# Patient Record
Sex: Female | Born: 1960 | Race: White | Hispanic: No | Marital: Married | Smoking: Former smoker
Health system: Southern US, Community
[De-identification: ages and names within clinical notes are randomized; demographics above are authoritative.]

## PROBLEM LIST (undated history)

## (undated) DIAGNOSIS — G8929 Other chronic pain: Secondary | ICD-10-CM

## (undated) DIAGNOSIS — K859 Acute pancreatitis without necrosis or infection, unspecified: Secondary | ICD-10-CM

## (undated) DIAGNOSIS — N3010 Interstitial cystitis (chronic) without hematuria: Secondary | ICD-10-CM

## (undated) HISTORY — PX: CHOLECYSTECTOMY: SHX55

## (undated) HISTORY — PX: ERCP: SHX60

## (undated) HISTORY — PX: ABDOMINAL HYSTERECTOMY: SHX81

---

## 1998-08-07 ENCOUNTER — Inpatient Hospital Stay (HOSPITAL_COMMUNITY): Admission: AD | Admit: 1998-08-07 | Discharge: 1998-08-09 | Payer: Self-pay | Admitting: Obstetrics & Gynecology

## 1998-08-10 ENCOUNTER — Encounter (HOSPITAL_COMMUNITY): Admission: RE | Admit: 1998-08-10 | Discharge: 1998-09-11 | Payer: Self-pay | Admitting: Obstetrics & Gynecology

## 1999-01-23 ENCOUNTER — Ambulatory Visit (HOSPITAL_COMMUNITY): Admission: RE | Admit: 1999-01-23 | Discharge: 1999-01-23 | Payer: Self-pay | Admitting: Gastroenterology

## 1999-01-23 ENCOUNTER — Encounter: Payer: Self-pay | Admitting: Gastroenterology

## 1999-02-12 ENCOUNTER — Observation Stay (HOSPITAL_COMMUNITY): Admission: RE | Admit: 1999-02-12 | Discharge: 1999-02-13 | Payer: Self-pay

## 1999-08-15 ENCOUNTER — Inpatient Hospital Stay (HOSPITAL_COMMUNITY): Admission: EM | Admit: 1999-08-15 | Discharge: 1999-10-18 | Payer: Self-pay | Admitting: Emergency Medicine

## 1999-08-15 ENCOUNTER — Ambulatory Visit (HOSPITAL_COMMUNITY): Admission: RE | Admit: 1999-08-15 | Discharge: 1999-08-15 | Payer: Self-pay | Admitting: Gastroenterology

## 1999-08-15 ENCOUNTER — Encounter: Payer: Self-pay | Admitting: Gastroenterology

## 1999-08-21 ENCOUNTER — Encounter: Payer: Self-pay | Admitting: Gastroenterology

## 1999-08-22 ENCOUNTER — Encounter: Payer: Self-pay | Admitting: Gastroenterology

## 1999-08-26 ENCOUNTER — Encounter: Payer: Self-pay | Admitting: Internal Medicine

## 1999-08-27 ENCOUNTER — Encounter: Payer: Self-pay | Admitting: Internal Medicine

## 1999-08-28 ENCOUNTER — Encounter: Payer: Self-pay | Admitting: Critical Care Medicine

## 1999-08-28 ENCOUNTER — Encounter: Payer: Self-pay | Admitting: *Deleted

## 1999-08-29 ENCOUNTER — Encounter: Payer: Self-pay | Admitting: Internal Medicine

## 1999-08-30 ENCOUNTER — Encounter: Payer: Self-pay | Admitting: Internal Medicine

## 1999-08-31 ENCOUNTER — Encounter: Payer: Self-pay | Admitting: Gastroenterology

## 1999-09-01 ENCOUNTER — Encounter: Payer: Self-pay | Admitting: Gastroenterology

## 1999-09-02 ENCOUNTER — Encounter: Payer: Self-pay | Admitting: Gastroenterology

## 1999-09-03 ENCOUNTER — Encounter: Payer: Self-pay | Admitting: Gastroenterology

## 1999-09-04 ENCOUNTER — Encounter: Payer: Self-pay | Admitting: Gastroenterology

## 1999-09-05 ENCOUNTER — Encounter: Payer: Self-pay | Admitting: Gastroenterology

## 1999-09-09 ENCOUNTER — Encounter: Payer: Self-pay | Admitting: Gastroenterology

## 1999-09-13 ENCOUNTER — Encounter: Payer: Self-pay | Admitting: Gastroenterology

## 1999-09-14 ENCOUNTER — Encounter: Payer: Self-pay | Admitting: Critical Care Medicine

## 1999-09-17 ENCOUNTER — Encounter: Payer: Self-pay | Admitting: Internal Medicine

## 1999-11-27 ENCOUNTER — Ambulatory Visit (HOSPITAL_COMMUNITY): Admission: RE | Admit: 1999-11-27 | Discharge: 1999-11-27 | Payer: Self-pay | Admitting: Gastroenterology

## 2000-02-17 ENCOUNTER — Other Ambulatory Visit: Admission: RE | Admit: 2000-02-17 | Discharge: 2000-02-17 | Payer: Self-pay | Admitting: Obstetrics & Gynecology

## 2004-10-16 ENCOUNTER — Ambulatory Visit: Payer: Self-pay | Admitting: Urology

## 2005-06-13 ENCOUNTER — Observation Stay (HOSPITAL_COMMUNITY): Admission: RE | Admit: 2005-06-13 | Discharge: 2005-06-14 | Payer: Self-pay | Admitting: Gynecology

## 2005-09-24 ENCOUNTER — Ambulatory Visit: Payer: Self-pay | Admitting: Urology

## 2006-09-03 ENCOUNTER — Ambulatory Visit: Payer: Self-pay

## 2006-10-07 ENCOUNTER — Ambulatory Visit: Payer: Self-pay | Admitting: Urology

## 2007-02-17 ENCOUNTER — Ambulatory Visit: Payer: Self-pay | Admitting: Unknown Physician Specialty

## 2007-02-24 ENCOUNTER — Ambulatory Visit: Payer: Self-pay | Admitting: Unknown Physician Specialty

## 2007-10-07 ENCOUNTER — Ambulatory Visit: Payer: Self-pay | Admitting: Unknown Physician Specialty

## 2007-11-11 ENCOUNTER — Ambulatory Visit: Payer: Self-pay

## 2007-12-11 ENCOUNTER — Ambulatory Visit: Payer: Self-pay | Admitting: Internal Medicine

## 2009-01-18 ENCOUNTER — Ambulatory Visit: Payer: Self-pay | Admitting: Unknown Physician Specialty

## 2009-10-31 ENCOUNTER — Ambulatory Visit: Payer: Self-pay | Admitting: Urology

## 2010-04-25 ENCOUNTER — Ambulatory Visit: Payer: Self-pay | Admitting: Obstetrics and Gynecology

## 2010-10-31 ENCOUNTER — Ambulatory Visit: Payer: Self-pay | Admitting: Urology

## 2010-11-06 ENCOUNTER — Ambulatory Visit: Payer: Self-pay | Admitting: Urology

## 2011-10-15 ENCOUNTER — Ambulatory Visit: Payer: Self-pay | Admitting: Urology

## 2011-10-29 ENCOUNTER — Ambulatory Visit: Payer: Self-pay | Admitting: Urology

## 2012-06-30 ENCOUNTER — Ambulatory Visit: Payer: Self-pay | Admitting: Unknown Physician Specialty

## 2012-07-14 ENCOUNTER — Ambulatory Visit: Payer: Self-pay | Admitting: Unknown Physician Specialty

## 2012-07-15 LAB — PATHOLOGY REPORT

## 2012-07-21 ENCOUNTER — Ambulatory Visit: Payer: Self-pay | Admitting: Obstetrics and Gynecology

## 2012-11-10 ENCOUNTER — Ambulatory Visit: Payer: Self-pay | Admitting: Urology

## 2013-03-02 ENCOUNTER — Emergency Department: Payer: Self-pay | Admitting: Emergency Medicine

## 2013-03-02 LAB — COMPREHENSIVE METABOLIC PANEL
Albumin: 3.7 g/dL (ref 3.4–5.0)
Alkaline Phosphatase: 192 U/L — ABNORMAL HIGH (ref 50–136)
Anion Gap: 7 (ref 7–16)
BUN: 6 mg/dL — ABNORMAL LOW (ref 7–18)
Bilirubin,Total: 0.2 mg/dL (ref 0.2–1.0)
Calcium, Total: 8.7 mg/dL (ref 8.5–10.1)
Chloride: 106 mmol/L (ref 98–107)
Co2: 25 mmol/L (ref 21–32)
Creatinine: 0.71 mg/dL (ref 0.60–1.30)
EGFR (African American): >60
EGFR (Non-African Amer.): >60
Glucose: 90 mg/dL (ref 65–99)
Osmolality: 273 (ref 275–301)
Potassium: 4 mmol/L (ref 3.5–5.1)
SGOT(AST): 51 U/L — ABNORMAL HIGH (ref 15–37)
SGPT (ALT): 69 U/L (ref 12–78)
Sodium: 138 mmol/L (ref 136–145)
Total Protein: 7.1 g/dL (ref 6.4–8.2)

## 2013-03-02 LAB — CBC
HCT: 42.7 % (ref 35.0–47.0)
HGB: 13.9 g/dL (ref 12.0–16.0)
MCH: 29.7 pg (ref 26.0–34.0)
MCHC: 32.6 g/dL (ref 32.0–36.0)
MCV: 91 fL (ref 80–100)
Platelet: 184 10*3/uL (ref 150–440)
RBC: 4.69 10*6/uL (ref 3.80–5.20)
RDW: 13 % (ref 11.5–14.5)
WBC: 8.1 10*3/uL (ref 3.6–11.0)

## 2013-03-02 LAB — LIPASE, BLOOD: Lipase: 138 U/L (ref 73–393)

## 2013-03-10 ENCOUNTER — Ambulatory Visit: Payer: Self-pay | Admitting: Unknown Physician Specialty

## 2013-03-14 ENCOUNTER — Ambulatory Visit: Payer: Self-pay | Admitting: Unknown Physician Specialty

## 2013-03-16 LAB — PATHOLOGY REPORT

## 2013-09-21 ENCOUNTER — Ambulatory Visit: Payer: Self-pay | Admitting: Urology

## 2014-01-19 ENCOUNTER — Ambulatory Visit: Payer: Self-pay | Admitting: Obstetrics and Gynecology

## 2015-03-23 NOTE — Op Note (Signed)
PATIENT NAME:  Amanda Gould, Amanda Gould MR#:  [id number] DATE OF BIRTH:  Apr 15, 1961  DATE OF PROCEDURE:  09/21/2013  PREOPERATIVE DIAGNOSIS: Chronic interstitial cystitis.   POSTOPERATIVE DIAGNOSIS: Chronic interstitial cystitis.   PROCEDURE: Cystoscopy with hydrodistention.   SURGEON: Lawana Hartzell C. Bernardo Heater, MD  ASSISTANT: None.   ANESTHESIA: General.   INDICATIONS: This is a 54 year old female with a long history of interstitial cystitis. Her symptoms have been managed with annual cystoscopy and hydrodistention. Over the past 2 months, she complains of increased frequency, urgency and suprapubic discomfort. She denies gross hematuria.   DESCRIPTION OF PROCEDURE: The patient was taken to the cystoscopy suite and placed on the table in the supine position. General anesthetic was administered via a LMA. She was then placed in the low lithotomy position and her external genitalia were prepped and draped in the usual fashion. Timeout was performed per hospital protocol with all in agreement. A 21-French cystoscope sheath with obturator was lubricated and passed per urethra. Panendoscopy was performed and the bladder mucosa was normal in appearance without erythema, solid or papillary lesions. The ureteral orifices were normal-appearing with clear efflux bilaterally. Under 80 cm water pressure, the bladder was filled to equilibrium and kept distended for 5 minutes. Bladder was drained with 1000 mL of saline obtained. Repeat cystoscopy shows no glomerulations or ulcerations and mild mucosal erythema. Bladder was again re-distended in a similar fashion with return of 1200 mL of irrigant. Again, no glomerulations or ulcerations were seen. A B and O suppository was placed per rectum. She was taken to the PACU in stable condition. There were no complications. EBL zero.  ____________________________ Ronda Fairly Bernardo Heater, MD scs:sb D: 09/22/2013 08:18:42 ET T: 09/22/2013 08:32:51 ET JOB#: [id number]  cc: Nicki Reaper C. Bernardo Heater,  MD, <Dictator> Abbie Sons MD ELECTRONICALLY SIGNED 10/05/2013 8:42

## 2016-12-12 DIAGNOSIS — R300 Dysuria: Secondary | ICD-10-CM | POA: Diagnosis not present

## 2017-01-13 DIAGNOSIS — G894 Chronic pain syndrome: Secondary | ICD-10-CM | POA: Diagnosis not present

## 2017-01-13 DIAGNOSIS — R1084 Generalized abdominal pain: Secondary | ICD-10-CM | POA: Diagnosis not present

## 2017-03-05 DIAGNOSIS — G890 Central pain syndrome: Secondary | ICD-10-CM | POA: Diagnosis not present

## 2017-04-08 DIAGNOSIS — G894 Chronic pain syndrome: Secondary | ICD-10-CM | POA: Diagnosis not present

## 2017-04-08 DIAGNOSIS — R1084 Generalized abdominal pain: Secondary | ICD-10-CM | POA: Diagnosis not present

## 2017-04-14 DIAGNOSIS — N952 Postmenopausal atrophic vaginitis: Secondary | ICD-10-CM | POA: Diagnosis not present

## 2017-05-25 DIAGNOSIS — N390 Urinary tract infection, site not specified: Secondary | ICD-10-CM | POA: Diagnosis not present

## 2017-06-24 DIAGNOSIS — G894 Chronic pain syndrome: Secondary | ICD-10-CM | POA: Diagnosis not present

## 2017-07-06 DIAGNOSIS — R1084 Generalized abdominal pain: Secondary | ICD-10-CM | POA: Diagnosis not present

## 2017-07-06 DIAGNOSIS — G894 Chronic pain syndrome: Secondary | ICD-10-CM | POA: Diagnosis not present

## 2017-07-10 DIAGNOSIS — R350 Frequency of micturition: Secondary | ICD-10-CM | POA: Diagnosis not present

## 2017-07-10 DIAGNOSIS — R3915 Urgency of urination: Secondary | ICD-10-CM | POA: Diagnosis not present

## 2017-10-12 DIAGNOSIS — G894 Chronic pain syndrome: Secondary | ICD-10-CM | POA: Diagnosis not present

## 2017-10-12 DIAGNOSIS — N3010 Interstitial cystitis (chronic) without hematuria: Secondary | ICD-10-CM | POA: Diagnosis not present

## 2017-10-12 DIAGNOSIS — R1084 Generalized abdominal pain: Secondary | ICD-10-CM | POA: Diagnosis not present

## 2017-11-27 DIAGNOSIS — H9203 Otalgia, bilateral: Secondary | ICD-10-CM | POA: Diagnosis not present

## 2017-11-27 DIAGNOSIS — J0110 Acute frontal sinusitis, unspecified: Secondary | ICD-10-CM | POA: Diagnosis not present

## 2017-12-06 ENCOUNTER — Telehealth: Payer: Self-pay | Admitting: *Deleted

## 2017-12-06 ENCOUNTER — Other Ambulatory Visit: Payer: Self-pay

## 2017-12-06 ENCOUNTER — Encounter: Payer: Self-pay | Admitting: *Deleted

## 2017-12-06 ENCOUNTER — Ambulatory Visit
Admission: EM | Admit: 2017-12-06 | Discharge: 2017-12-06 | Disposition: A | Discharge status: Home / Self Care | Payer: 59 | Attending: Family Medicine | Admitting: Family Medicine

## 2017-12-06 DIAGNOSIS — H66001 Acute suppurative otitis media without spontaneous rupture of ear drum, right ear: Secondary | ICD-10-CM | POA: Diagnosis not present

## 2017-12-06 DIAGNOSIS — J069 Acute upper respiratory infection, unspecified: Secondary | ICD-10-CM

## 2017-12-06 DIAGNOSIS — R05 Cough: Secondary | ICD-10-CM | POA: Diagnosis not present

## 2017-12-06 HISTORY — DX: Acute pancreatitis without necrosis or infection, unspecified: K85.90

## 2017-12-06 HISTORY — DX: Interstitial cystitis (chronic) without hematuria: N30.10

## 2017-12-06 HISTORY — DX: Other chronic pain: G89.29

## 2017-12-06 MED ORDER — CEFDINIR 300 MG PO CAPS: 300.0000 mg | ORAL_CAPSULE | Freq: Two times a day (BID) | ORAL | 0 refills | Status: DC

## 2017-12-06 MED ORDER — CEFTRIAXONE SODIUM 1 G IJ SOLR
1.0000 g | Freq: Once | INTRAMUSCULAR | Status: AC
  Administered 2017-12-06: 1 g via INTRAMUSCULAR

## 2017-12-06 MED ORDER — AMOXICILLIN 500 MG PO CAPS: 500.0000 mg | ORAL_CAPSULE | Freq: Three times a day (TID) | ORAL | 0 refills | Status: AC

## 2017-12-06 NOTE — ED Provider Notes (Signed)
[facility]    CSN: [id number] Arrival date & time: 12/06/17  0804  History   Chief Complaint Chief Complaint  Patient presents with  . Otalgia  . Nasal Congestion  . Cough   HPI  57 year old female presents with the above complaints.  Patient states that she has been sick since around Christmas.  She saw her primary care doctor on 12/28 and was placed on doxycycline for sinusitis.  She presents today with continued complaints of congestion, cough.  She now has developed right ear pain.  Was severe last night.  She is concerned that she has a ear infection of the right ear.  She has been using over-the-counter medication (Sudafed, Ibuprofen, Benadryl) without improvement.  No reports of fever.  No known exacerbating or relieving factors.  Her pain is currently 5/10 in severity.  No other complaints or concerns at this time.  Past Medical History:  Diagnosis Date  . Chronic pain   . Interstitial cystitis   . Pancreatitis    Past Surgical History:  Procedure Laterality Date  . ABDOMINAL HYSTERECTOMY    . CHOLECYSTECTOMY    . ERCP     OB History    No data available     Home Medications    Prior to Admission medications   Medication Sig Start Date End Date Taking? Authorizing Provider  buPROPion (WELLBUTRIN) 75 MG tablet Take 75 mg by mouth every morning.   Yes [provider]  escitalopram (LEXAPRO) 5 MG tablet Take 5 mg by mouth daily.   Yes [provider]  gabapentin (NEURONTIN) 600 MG tablet Take 2,400 mg by mouth at bedtime.   Yes [provider]  oxyCODONE (OXYCONTIN) 20 mg 12 hr tablet Take 20 mg by mouth daily with lunch.   Yes [provider]  oxyCODONE (OXYCONTIN) 40 mg 12 hr tablet Take 40 mg by mouth every 12 (twelve) hours.   Yes [provider]  OxyCODONE HCl, Abuse Deter, (OXAYDO) 5 MG TABA Take by mouth 4 (four) times daily as needed.   Yes [provider]  topiramate (TOPAMAX) 200 MG tablet  Take 200 mg by mouth at bedtime.   Yes [provider]  cefdinir (OMNICEF) 300 MG capsule Take 1 capsule (300 mg total) by mouth 2 (two) times daily. 12/06/17   Coral Spikes, DO  clonazePAM (KLONOPIN) 0.5 MG tablet Take 0.5 mg by mouth 2 (two) times daily as needed for anxiety.    [provider]  tiZANidine (ZANAFLEX) 4 MG capsule Take 4 mg by mouth 3 (three) times daily as needed for muscle spasms.    [provider]  zolpidem (AMBIEN CR) 12.5 MG CR tablet Take 12.5 mg by mouth at bedtime as needed for sleep.    [provider]   Family History Family History  Problem Relation Age of Onset  . Heart attack Mother   . Heart attack Father    Social History Social History   Tobacco Use  . Smoking status: Former Research scientist (life sciences)  . Smokeless tobacco: Never Used  Substance Use Topics  . Alcohol use: Yes  . Drug use: No   Allergies   Patient has no known allergies.   Review of Systems Review of Systems  HENT: Positive for congestion and ear pain.   Respiratory: Positive for cough.   All other systems reviewed and are negative.  Physical Exam Triage Vital Signs ED Triage Vitals [12/06/17 0818]  Enc Vitals Group     BP  140/73     Pulse Rate 80     Resp 16     Temp 97.9 F (36.6 C)     Temp Source Oral     SpO2 100 %     Weight 114 lb (51.7 kg)     Height 5\' 5"  (1.651 m)     Head Circumference      Peak Flow      Pain Score 5     Pain Loc      Pain Edu?      Excl. in [place]?    Updated Vital Signs BP 140/73 (BP Location: Left Arm)   Pulse 80   Temp 97.9 F (36.6 C) (Oral)   Resp 16   Ht 5\' 5"  (1.651 m)   Wt 114 lb (51.7 kg)   SpO2 100%   BMI 18.97 kg/m     Physical Exam  Constitutional: She is oriented to person, place, and time. She appears well-developed and well-nourished. No distress.  HENT:  Head: Normocephalic and atraumatic.  Left TM normal. Right TM with bulging, erythema, and effusion.  She has a small bullae as well.  Eyes:  Conjunctivae are normal. Right eye exhibits no discharge. Left eye exhibits no discharge.  Neck: Neck supple.  Cardiovascular: Normal rate and regular rhythm.  No murmur heard. Pulmonary/Chest: Effort normal and breath sounds normal. No respiratory distress. She has no wheezes. She has no rales.  Lymphadenopathy:    She has no cervical adenopathy.  Neurological: She is alert and oriented to person, place, and time.  Skin: Skin is warm. No rash noted.  Psychiatric: She has a normal mood and affect. Her behavior is normal.  Nursing note and vitals reviewed.  UC Treatments / Results  Labs (all labs ordered are listed, but only abnormal results are displayed) Labs Reviewed - No data to display  EKG  EKG Interpretation None      Radiology No results found.  Procedures Procedures (including critical care time)  Medications Ordered in UC Medications  cefTRIAXone (ROCEPHIN) injection 1 g (1 g Intramuscular Given 12/06/17 0854)   Initial Impression / Assessment and Plan / UC Course  I have reviewed the triage vital signs and the nursing notes.  Pertinent labs & imaging results that were available during my care of the patient were reviewed by me and considered in my medical decision making (see chart for details).     57 year old female presents with URI and otitis media. Treating with Omnicef (IM Rocephin given today given her reports of severe diarrhea with antibiotics).  Final Clinical Impressions(s) / UC Diagnoses   Final diagnoses:  Upper respiratory tract infection, unspecified type  Acute suppurative otitis media of right ear without spontaneous rupture of tympanic membrane, recurrence not specified   ED Discharge Orders        Ordered    cefdinir (OMNICEF) 300 MG capsule  2 times daily     12/06/17 0852     Controlled Substance Prescriptions [place] Controlled Substance Registry consulted? No   Coral Spikes, [place] 12/06/17 [phone]

## 2017-12-06 NOTE — ED Triage Notes (Signed)
Patient has had symptoms of nasal congestion drainage, cough, sore throat, and ear pain over 1 week ago. Patient reports severe right ear pain starting yesterday. Patient is presently taking doxycycline.

## 2017-12-06 NOTE — Discharge Instructions (Signed)
Antibiotic as prescribed.  Ibuprofen 800 mg three times daily as needed.  Probiotic. Flonase for congestion.  Take care  Dr. Lacinda Axon

## 2018-01-06 DIAGNOSIS — R1084 Generalized abdominal pain: Secondary | ICD-10-CM | POA: Diagnosis not present

## 2018-01-06 DIAGNOSIS — G894 Chronic pain syndrome: Secondary | ICD-10-CM | POA: Diagnosis not present

## 2018-04-06 DIAGNOSIS — G894 Chronic pain syndrome: Secondary | ICD-10-CM | POA: Diagnosis not present

## 2018-04-06 DIAGNOSIS — R1084 Generalized abdominal pain: Secondary | ICD-10-CM | POA: Diagnosis not present

## 2018-05-28 ENCOUNTER — Other Ambulatory Visit: Payer: Self-pay | Admitting: Family

## 2018-05-28 DIAGNOSIS — Z1231 Encounter for screening mammogram for malignant neoplasm of breast: Secondary | ICD-10-CM

## 2018-06-29 DIAGNOSIS — R1084 Generalized abdominal pain: Secondary | ICD-10-CM | POA: Diagnosis not present

## 2018-06-29 DIAGNOSIS — G894 Chronic pain syndrome: Secondary | ICD-10-CM | POA: Diagnosis not present

## 2018-07-14 DIAGNOSIS — Z86010 Personal history of colonic polyps: Secondary | ICD-10-CM | POA: Diagnosis not present

## 2018-07-29 ENCOUNTER — Ambulatory Visit
Admission: RE | Admit: 2018-07-29 | Discharge: 2018-07-29 | Disposition: A | Discharge status: Home / Self Care | Payer: 59 | Source: Ambulatory Visit | Attending: Family | Admitting: Family

## 2018-07-29 DIAGNOSIS — Z1231 Encounter for screening mammogram for malignant neoplasm of breast: Secondary | ICD-10-CM | POA: Insufficient documentation

## 2018-09-09 DIAGNOSIS — D124 Benign neoplasm of descending colon: Secondary | ICD-10-CM | POA: Diagnosis not present

## 2018-09-09 DIAGNOSIS — D127 Benign neoplasm of rectosigmoid junction: Secondary | ICD-10-CM | POA: Diagnosis not present

## 2018-09-09 DIAGNOSIS — D125 Benign neoplasm of sigmoid colon: Secondary | ICD-10-CM | POA: Diagnosis not present

## 2018-09-09 DIAGNOSIS — D128 Benign neoplasm of rectum: Secondary | ICD-10-CM | POA: Diagnosis not present

## 2018-09-09 DIAGNOSIS — K635 Polyp of colon: Secondary | ICD-10-CM | POA: Diagnosis not present

## 2018-09-09 DIAGNOSIS — Z86010 Personal history of colonic polyps: Secondary | ICD-10-CM | POA: Diagnosis not present

## 2018-09-28 DIAGNOSIS — R1084 Generalized abdominal pain: Secondary | ICD-10-CM | POA: Diagnosis not present

## 2018-09-28 DIAGNOSIS — G894 Chronic pain syndrome: Secondary | ICD-10-CM | POA: Diagnosis not present

## 2018-12-07 DIAGNOSIS — G890 Central pain syndrome: Secondary | ICD-10-CM | POA: Diagnosis not present

## 2018-12-07 DIAGNOSIS — Z23 Encounter for immunization: Secondary | ICD-10-CM | POA: Diagnosis not present

## 2018-12-27 DIAGNOSIS — R1084 Generalized abdominal pain: Secondary | ICD-10-CM | POA: Diagnosis not present

## 2018-12-27 DIAGNOSIS — G894 Chronic pain syndrome: Secondary | ICD-10-CM | POA: Diagnosis not present

## 2019-02-09 DIAGNOSIS — R208 Other disturbances of skin sensation: Secondary | ICD-10-CM | POA: Diagnosis not present

## 2019-02-09 DIAGNOSIS — M9901 Segmental and somatic dysfunction of cervical region: Secondary | ICD-10-CM | POA: Diagnosis not present

## 2019-02-09 DIAGNOSIS — M531 Cervicobrachial syndrome: Secondary | ICD-10-CM | POA: Diagnosis not present

## 2019-02-11 DIAGNOSIS — M531 Cervicobrachial syndrome: Secondary | ICD-10-CM | POA: Diagnosis not present

## 2019-02-11 DIAGNOSIS — R208 Other disturbances of skin sensation: Secondary | ICD-10-CM | POA: Diagnosis not present

## 2019-02-11 DIAGNOSIS — M9901 Segmental and somatic dysfunction of cervical region: Secondary | ICD-10-CM | POA: Diagnosis not present

## 2019-02-14 DIAGNOSIS — M531 Cervicobrachial syndrome: Secondary | ICD-10-CM | POA: Diagnosis not present

## 2019-02-14 DIAGNOSIS — M9901 Segmental and somatic dysfunction of cervical region: Secondary | ICD-10-CM | POA: Diagnosis not present

## 2019-02-14 DIAGNOSIS — R208 Other disturbances of skin sensation: Secondary | ICD-10-CM | POA: Diagnosis not present

## 2019-02-16 DIAGNOSIS — M9901 Segmental and somatic dysfunction of cervical region: Secondary | ICD-10-CM | POA: Diagnosis not present

## 2019-02-16 DIAGNOSIS — R208 Other disturbances of skin sensation: Secondary | ICD-10-CM | POA: Diagnosis not present

## 2019-02-16 DIAGNOSIS — M531 Cervicobrachial syndrome: Secondary | ICD-10-CM | POA: Diagnosis not present

## 2019-02-18 DIAGNOSIS — M9901 Segmental and somatic dysfunction of cervical region: Secondary | ICD-10-CM | POA: Diagnosis not present

## 2019-02-18 DIAGNOSIS — M531 Cervicobrachial syndrome: Secondary | ICD-10-CM | POA: Diagnosis not present

## 2019-02-18 DIAGNOSIS — R208 Other disturbances of skin sensation: Secondary | ICD-10-CM | POA: Diagnosis not present

## 2019-03-28 DIAGNOSIS — M9901 Segmental and somatic dysfunction of cervical region: Secondary | ICD-10-CM | POA: Diagnosis not present

## 2019-03-28 DIAGNOSIS — M531 Cervicobrachial syndrome: Secondary | ICD-10-CM | POA: Diagnosis not present

## 2019-03-28 DIAGNOSIS — R208 Other disturbances of skin sensation: Secondary | ICD-10-CM | POA: Diagnosis not present

## 2019-03-30 DIAGNOSIS — G894 Chronic pain syndrome: Secondary | ICD-10-CM | POA: Diagnosis not present

## 2019-03-30 DIAGNOSIS — R1084 Generalized abdominal pain: Secondary | ICD-10-CM | POA: Diagnosis not present

## 2019-04-04 DIAGNOSIS — M9901 Segmental and somatic dysfunction of cervical region: Secondary | ICD-10-CM | POA: Diagnosis not present

## 2019-04-04 DIAGNOSIS — R208 Other disturbances of skin sensation: Secondary | ICD-10-CM | POA: Diagnosis not present

## 2019-04-04 DIAGNOSIS — M531 Cervicobrachial syndrome: Secondary | ICD-10-CM | POA: Diagnosis not present

## 2019-07-06 ENCOUNTER — Other Ambulatory Visit: Payer: Self-pay | Admitting: Chiropractic Medicine

## 2019-07-06 DIAGNOSIS — M62838 Other muscle spasm: Secondary | ICD-10-CM

## 2019-07-06 DIAGNOSIS — R208 Other disturbances of skin sensation: Secondary | ICD-10-CM

## 2019-07-06 DIAGNOSIS — M542 Cervicalgia: Secondary | ICD-10-CM

## 2019-07-08 ENCOUNTER — Ambulatory Visit
Admission: RE | Admit: 2019-07-08 | Discharge: 2019-07-08 | Disposition: A | Discharge status: Home / Self Care | Payer: 59 | Source: Ambulatory Visit | Attending: Chiropractic Medicine | Admitting: Chiropractic Medicine

## 2019-07-08 ENCOUNTER — Other Ambulatory Visit: Payer: Self-pay

## 2019-07-08 DIAGNOSIS — M62838 Other muscle spasm: Secondary | ICD-10-CM

## 2019-07-08 DIAGNOSIS — R208 Other disturbances of skin sensation: Secondary | ICD-10-CM | POA: Diagnosis present

## 2019-07-08 DIAGNOSIS — M542 Cervicalgia: Secondary | ICD-10-CM | POA: Insufficient documentation

## 2020-02-09 ENCOUNTER — Ambulatory Visit
Admission: RE | Admit: 2020-02-09 | Discharge: 2020-02-09 | Disposition: A | Discharge status: Home / Self Care | Payer: 59 | Source: Ambulatory Visit | Attending: Family | Admitting: Family

## 2020-02-09 ENCOUNTER — Other Ambulatory Visit: Payer: Self-pay

## 2020-02-09 ENCOUNTER — Other Ambulatory Visit: Payer: Self-pay | Admitting: Family

## 2020-02-09 DIAGNOSIS — R221 Localized swelling, mass and lump, neck: Secondary | ICD-10-CM

## 2021-01-16 ENCOUNTER — Ambulatory Visit: Admission: EM | Admit: 2021-01-16 | Discharge: 2021-01-16 | Discharge status: Against Medical Advice | Payer: 59

## 2021-01-16 ENCOUNTER — Other Ambulatory Visit: Payer: Self-pay

## 2021-01-17 ENCOUNTER — Ambulatory Visit
Admission: EM | Admit: 2021-01-17 | Discharge: 2021-01-17 | Disposition: A | Discharge status: Home / Self Care | Payer: 59 | Attending: Emergency Medicine | Admitting: Emergency Medicine

## 2021-01-17 ENCOUNTER — Encounter: Payer: Self-pay | Admitting: Emergency Medicine

## 2021-01-17 DIAGNOSIS — J069 Acute upper respiratory infection, unspecified: Secondary | ICD-10-CM | POA: Diagnosis not present

## 2021-01-17 MED ORDER — IPRATROPIUM BROMIDE 0.06 % NA SOLN: 2.0000 | Freq: Four times a day (QID) | NASAL | 12 refills | Status: DC

## 2021-01-17 MED ORDER — IPRATROPIUM BROMIDE 0.06 % NA SOLN: 2.0000 | Freq: Four times a day (QID) | NASAL | 12 refills | Status: AC

## 2021-01-17 NOTE — ED Triage Notes (Addendum)
Patient c/o nasal congestion and mild cough that started Sunday. Denies any other symptoms. Patient declines COVID testing.

## 2021-01-17 NOTE — ED Provider Notes (Signed)
[facility]    CSN: [id number] Arrival date & time: 01/17/21  0806      History   Chief Complaint Chief Complaint  Patient presents with  . Nasal Congestion  . Cough    HPI Amanda Gould is a 60 y.o. female.   HPI   60 year old female here for evaluation of nasal congestion and mild cough.  Patient reports that she has had symptoms for the last 4 days.  She is here today because her husband recently had similar symptoms that turned into bronchitis and he was sick for a week and a half.  Patient states that she is very sensitive to medications and cannot take Mucinex because it causes diarrhea.  She has been using over-the-counter Afrin and saline nasal spray.  She states that she does have an intermittent yellow nasal discharge and a nonproductive cough.  Patient denies fever, ear pain or pressure, sore throat, shortness of breath or wheezing, or GI complaints at present.  Past Medical History:  Diagnosis Date  . Chronic pain   . Interstitial cystitis   . Pancreatitis     There are no problems to display for this patient.   Past Surgical History:  Procedure Laterality Date  . ABDOMINAL HYSTERECTOMY    . CHOLECYSTECTOMY    . ERCP      OB History   No obstetric history on file.      Home Medications    Prior to Admission medications   Medication Sig Start Date End Date Taking? Authorizing Provider  buPROPion (WELLBUTRIN) 75 MG tablet Take 75 mg by mouth every morning.   Yes [provider]  clonazePAM (KLONOPIN) 0.5 MG tablet Take 0.5 mg by mouth 2 (two) times daily as needed for anxiety.   Yes [provider]  escitalopram (LEXAPRO) 5 MG tablet Take 5 mg by mouth daily.   Yes [provider]  gabapentin (NEURONTIN) 600 MG tablet Take 2,400 mg by mouth at bedtime.   Yes [provider]  ipratropium (ATROVENT) 0.06 % nasal spray Place 2 sprays into both nostrils 4 (four) times daily. 01/17/21  Yes Margarette Canada,  NP  oxyCODONE (OXYCONTIN) 20 mg 12 hr tablet Take 20 mg by mouth daily with lunch.   Yes [provider]  oxyCODONE (OXYCONTIN) 40 mg 12 hr tablet Take 40 mg by mouth every 12 (twelve) hours.   Yes [provider]  OxyCODONE HCl, Abuse Deter, (OXAYDO) 5 MG TABA Take by mouth 4 (four) times daily as needed.   Yes [provider]  tiZANidine (ZANAFLEX) 4 MG capsule Take 4 mg by mouth 3 (three) times daily as needed for muscle spasms.   Yes [provider]  topiramate (TOPAMAX) 200 MG tablet Take 200 mg by mouth at bedtime.   Yes [provider]  zolpidem (AMBIEN CR) 12.5 MG CR tablet Take 12.5 mg by mouth at bedtime as needed for sleep.   Yes [provider]    Family History Family History  Problem Relation Age of Onset  . Heart attack Mother   . Heart attack Father   . Breast cancer Maternal Aunt        mat great aunt    Social History Social History   Tobacco Use  . Smoking status: Former Research scientist (life sciences)  . Smokeless tobacco: Never Used  Vaping Use  . Vaping Use: Never used  Substance Use Topics  . Alcohol use: Yes  . Drug use: No  Allergies   Patient has no known allergies.   Review of Systems Review of Systems  Constitutional: Negative for activity change, appetite change and fever.  HENT: Positive for congestion. Negative for ear pain, rhinorrhea, sinus pressure, sinus pain and sore throat.   Respiratory: Positive for cough. Negative for shortness of breath and wheezing.   Gastrointestinal: Negative for diarrhea, nausea and vomiting.  Skin: Negative for rash.  Hematological: Negative.      Physical Exam Triage Vital Signs ED Triage Vitals  Enc Vitals Group     BP 01/17/21 0831 124/80     Pulse Rate 01/17/21 0831 100     Resp 01/17/21 0831 18     Temp 01/17/21 0831 98.9 F (37.2 C)     Temp Source 01/17/21 0831 Oral     SpO2 01/17/21 0831 100 %     Weight 01/17/21 0832 113 lb 15.7 oz (51.7 kg)     Height  01/17/21 0832 5\' 5"  (1.651 m)     Head Circumference --      Peak Flow --      Pain Score 01/17/21 0832 0     Pain Loc --      Pain Edu? --      Excl. in [place]? --    No data found.  Updated Vital Signs BP 124/80 (BP Location: Right Arm)   Pulse 100   Temp 98.9 F (37.2 C) (Oral)   Resp 18   Ht 5\' 5"  (1.651 m)   Wt 113 lb 15.7 oz (51.7 kg)   SpO2 100%   BMI 18.97 kg/m   Visual Acuity Right Eye Distance:   Left Eye Distance:   Bilateral Distance:    Right Eye Near:   Left Eye Near:    Bilateral Near:     Physical Exam Vitals and nursing note reviewed.  Constitutional:      General: She is not in acute distress.    Appearance: Normal appearance. She is normal weight. She is not ill-appearing.  HENT:     Head: Normocephalic and atraumatic.     Right Ear: Tympanic membrane, ear canal and external ear normal.     Left Ear: Tympanic membrane, ear canal and external ear normal.     Nose: Congestion present. No rhinorrhea.     Mouth/Throat:     Mouth: Mucous membranes are moist.     Pharynx: Oropharynx is clear. No posterior oropharyngeal erythema.  Cardiovascular:     Rate and Rhythm: Normal rate and regular rhythm.     Pulses: Normal pulses.     Heart sounds: Normal heart sounds. No murmur heard. No gallop.   Pulmonary:     Effort: Pulmonary effort is normal.     Breath sounds: Normal breath sounds. No wheezing, rhonchi or rales.  Musculoskeletal:     Cervical back: Normal range of motion and neck supple.  Lymphadenopathy:     Cervical: No cervical adenopathy.  Skin:    General: Skin is warm and dry.     Capillary Refill: Capillary refill takes less than 2 seconds.     Findings: No erythema or rash.  Neurological:     General: No focal deficit present.     Mental Status: She is alert and oriented to person, place, and time.  Psychiatric:        Mood and Affect: Mood normal.        Behavior: Behavior normal.        Thought Content: Thought content  normal.         Judgment: Judgment normal.      UC Treatments / Results  Labs (all labs ordered are listed, but only abnormal results are displayed) Labs Reviewed - No data to display  EKG   Radiology No results found.  Procedures Procedures (including critical care time)  Medications Ordered in UC Medications - No data to display  Initial Impression / Assessment and Plan / UC Course  I have reviewed the triage vital signs and the nursing notes.  Pertinent labs & imaging results that were available during my care of the patient were reviewed by me and considered in my medical decision making (see chart for details).   Patient is a very pleasant 60 year old female here for evaluation of nasal congestion and a mild cough this been going on for the past 4 days.  Patient is in no acute distress.  Physical exam reveals mildly erythematous edematous nasal mucosa without nasal discharge.  Patient sinuses are nontender to percussion.  Posterior oropharynx is pink and moist without postnasal drip.  No cervical adenopathy appreciated.  Lungs are clear to auscultation all fields.  Patient symptoms are consistent with mild upper respiratory infection, most likely viral.  Will treat with Atrovent nasal spray, sinus irrigation, increase fluid intake, and have patient return for new or worsening symptoms or follow-up with her PCP.   Final Clinical Impressions(s) / UC Diagnoses   Final diagnoses:  Viral URI with cough     Discharge Instructions     Irrigate your sinuses 2-3 times a day with a [employer] sinus rinse kit and distilled water.  Do not use tap water.  Use the ipratropium nasal spray, 2 squirts in each nostril 4 times a day, as needed for nasal congestion and discharge.  Increase your oral fluid intake to thin out your mucus and help your body clear the congestion.  Try eliminating gluten, dairy, sugar, and nightshades from your diet as these can all cause inflammation, including nasal  inflammation, and it may help with your chronic pain.  You can also try taking over-the-counter herbal supplement containing quercetin and/or nettles.  You can take 600 mg of both twice daily to help with nasal congestion as they are natural antihistamines.  Return for reevaluation, or see your primary care provider, for any new or worsening symptoms.    ED Prescriptions    Medication Sig Dispense Auth. Provider   ipratropium (ATROVENT) 0.06 % nasal spray Place 2 sprays into both nostrils 4 (four) times daily. 15 mL Margarette Canada, NP     PDMP not reviewed this encounter.   Margarette Canada, NP 01/17/21 [phone]

## 2021-01-17 NOTE — Discharge Instructions (Addendum)
Irrigate your sinuses 2-3 times a day with a [employer] sinus rinse kit and distilled water.  Do not use tap water.  Use the ipratropium nasal spray, 2 squirts in each nostril 4 times a day, as needed for nasal congestion and discharge.  Increase your oral fluid intake to thin out your mucus and help your body clear the congestion.  Try eliminating gluten, dairy, sugar, and nightshades from your diet as these can all cause inflammation, including nasal inflammation, and it may help with your chronic pain.  You can also try taking over-the-counter herbal supplement containing quercetin and/or nettles.  You can take 600 mg of both twice daily to help with nasal congestion as they are natural antihistamines.  Return for reevaluation, or see your primary care provider, for any new or worsening symptoms.

## 2021-11-27 ENCOUNTER — Other Ambulatory Visit: Payer: Self-pay | Admitting: Neurosurgery

## 2021-11-27 DIAGNOSIS — M5412 Radiculopathy, cervical region: Secondary | ICD-10-CM

## 2021-12-27 ENCOUNTER — Ambulatory Visit
Admission: RE | Admit: 2021-12-27 | Discharge: 2021-12-27 | Disposition: A | Discharge status: Home / Self Care | Payer: 59 | Source: Ambulatory Visit | Attending: Neurosurgery | Admitting: Neurosurgery

## 2021-12-27 DIAGNOSIS — M5412 Radiculopathy, cervical region: Secondary | ICD-10-CM

## 2022-03-10 IMAGING — MR MR CERVICAL SPINE W/O CM
5 series · 30 of 48 positions shown · non-contrast
Comparison: X-ray 02/09/2020

CLINICAL DATA: Neck pain and right arm numbness for 3 years

EXAM:
MRI CERVICAL SPINE WITHOUT CONTRAST
TECHNIQUE: Multiplanar, multisequence MR imaging of the cervical spine was
performed. No intravenous contrast was administered.

[Series 6: T1 · sagittal · 3.0mm · 0.86mm/px · 6 of 14 slices shown]
[im 1/14]
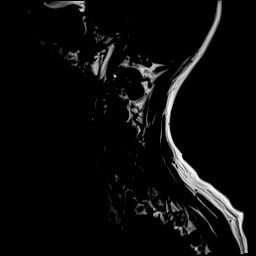
[im 3/14]
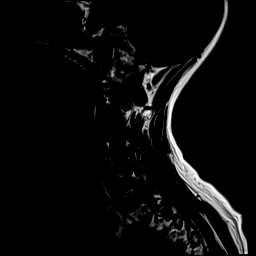
[im 6/14]
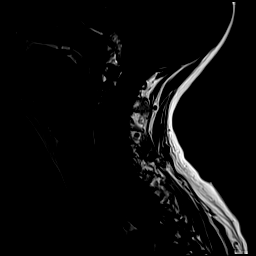
[im 8/14]
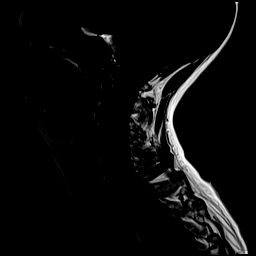
[im 11/14]
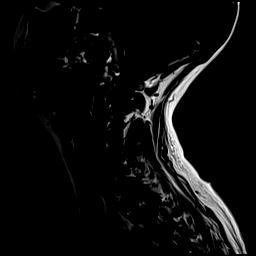
[im 14/14]
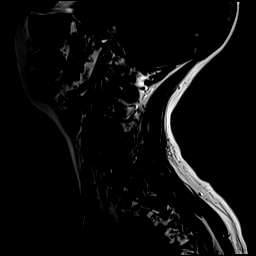

[Series 7: STIR · sagittal · 3.0mm · 0.34mm/px · 6 of 14 slices shown]
[im 1/14]
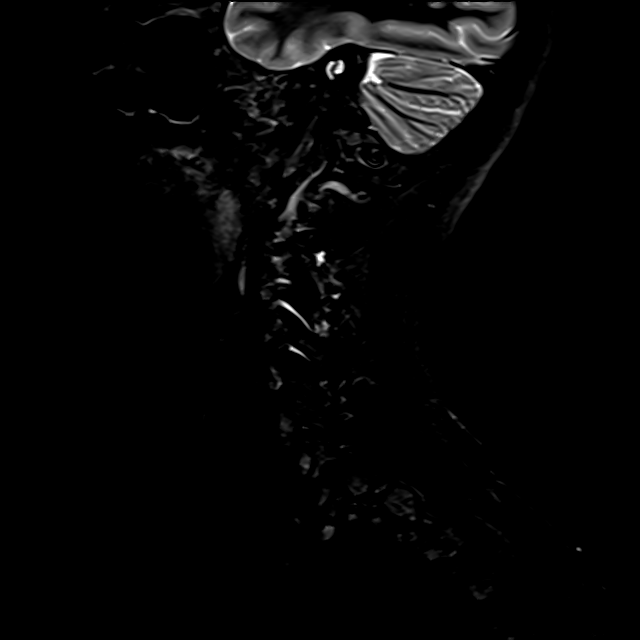
[im 3/14]
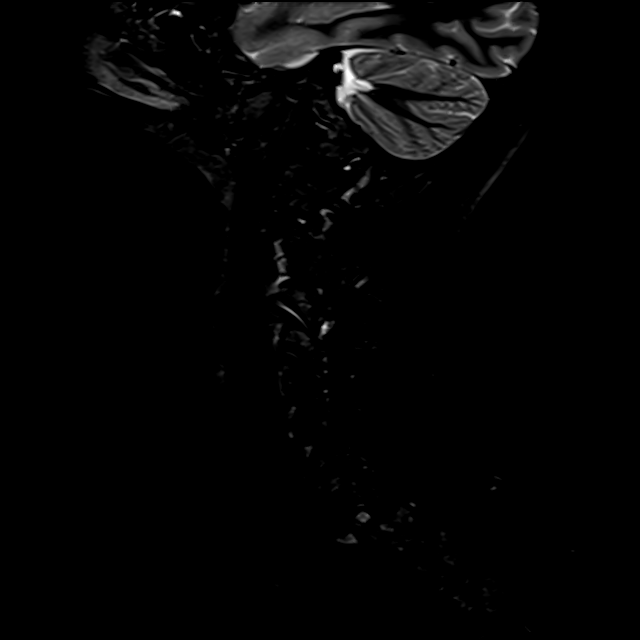
[im 6/14]
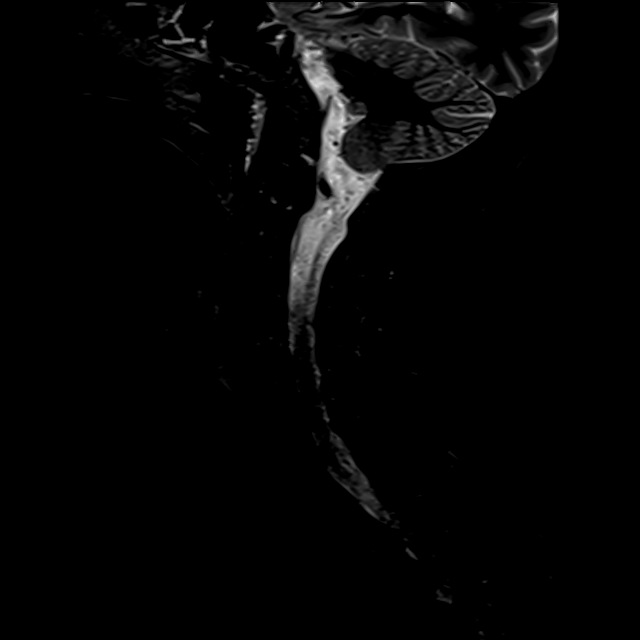
[im 8/14]
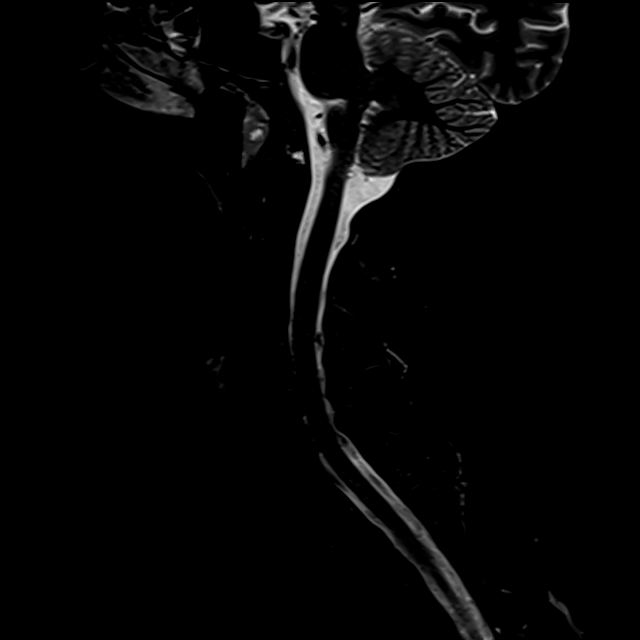
[im 11/14]
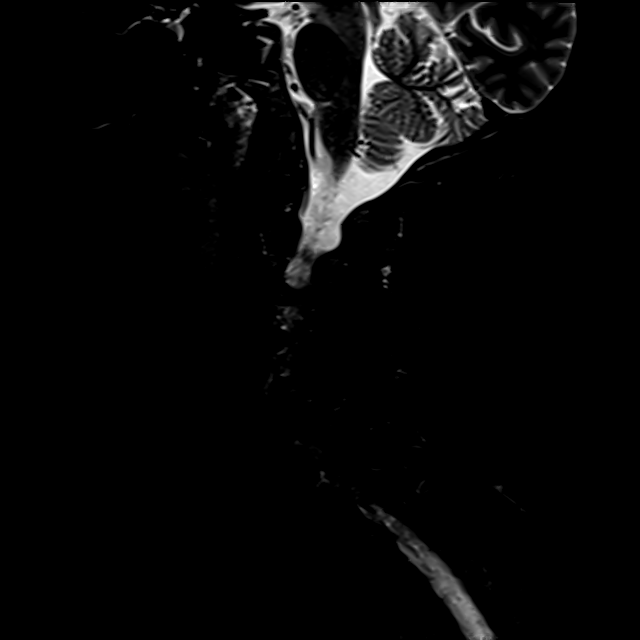
[im 14/14]
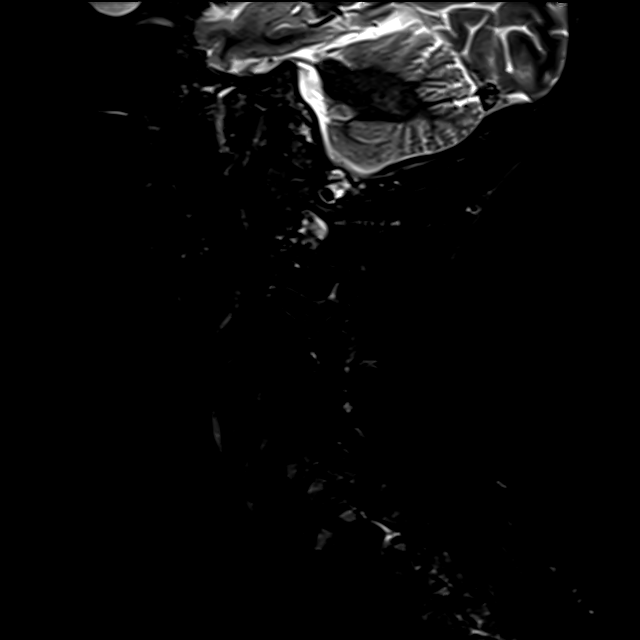

[Series 8: T2 · sagittal · 3.0mm · 0.69mm/px · 6 of 14 slices shown (1 of 2)]
[im 1/14]
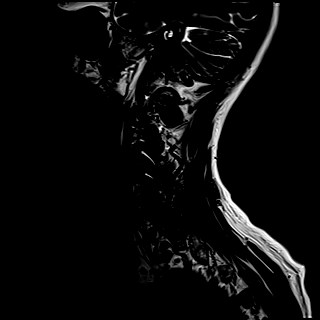
[im 3/14]
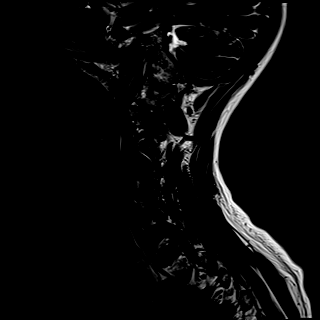
[im 6/14]
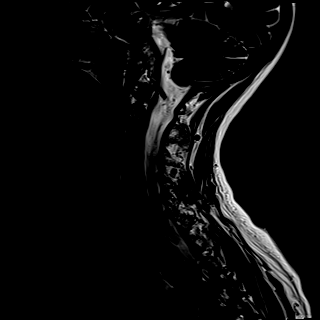
[im 8/14]
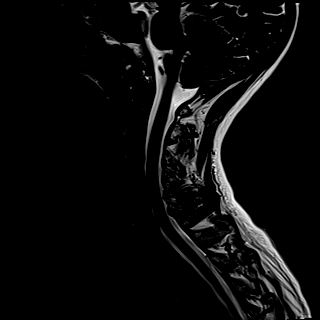
[im 11/14]
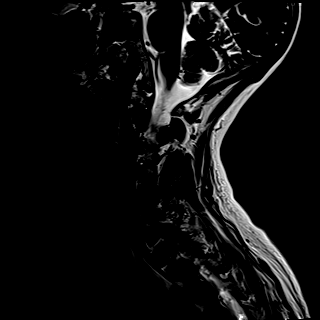
[im 14/14]
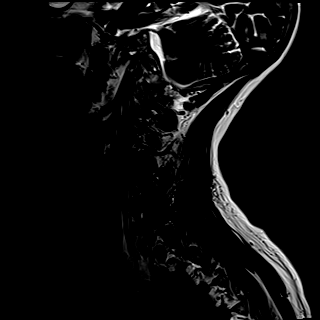

[Series 9: T2 · axial · 3.0mm · 0.70mm/px · z∈[+18,+121]mm · 9 of 33 slices shown (2 of 2)]
[im 1/33]
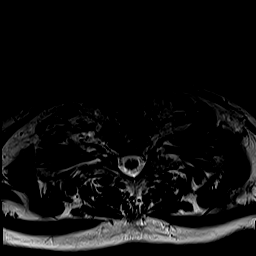
[im 5/33]
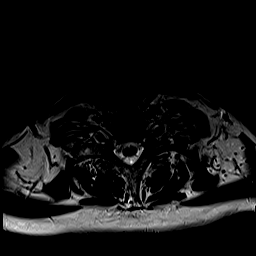
[im 10/33]
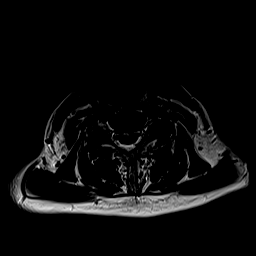
[im 14/33]
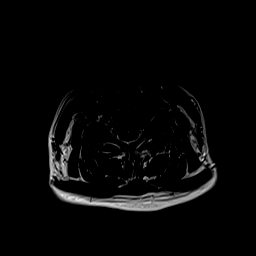
[im 17/33]
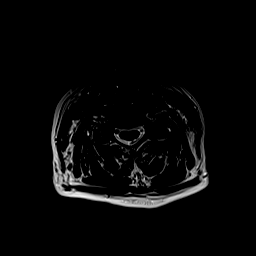
[im 19/33]
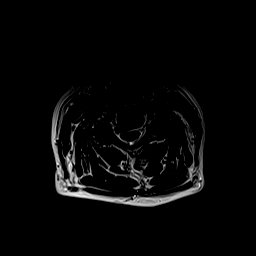
[im 23/33]
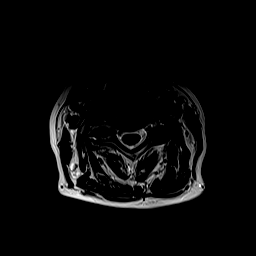
[im 28/33]
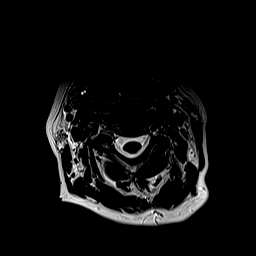
[im 33/33]
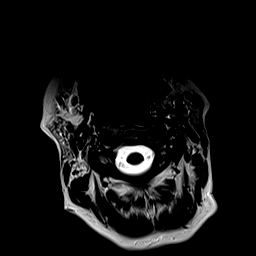

[Series 10: GRE · axial · 3.0mm · 0.35mm/px · z∈[+18,+47]mm · 3 of 33 slices shown]
[im 1/33]
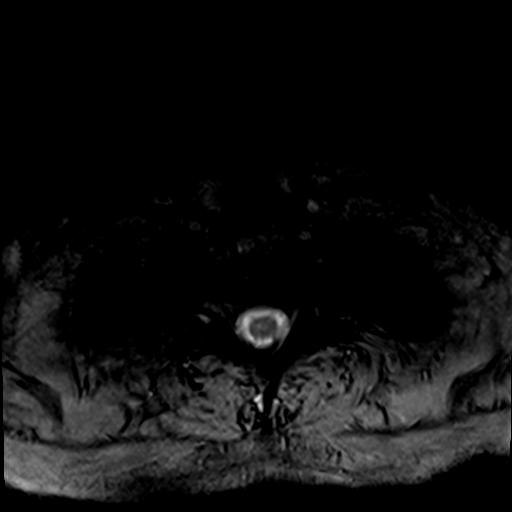
[im 5/33]
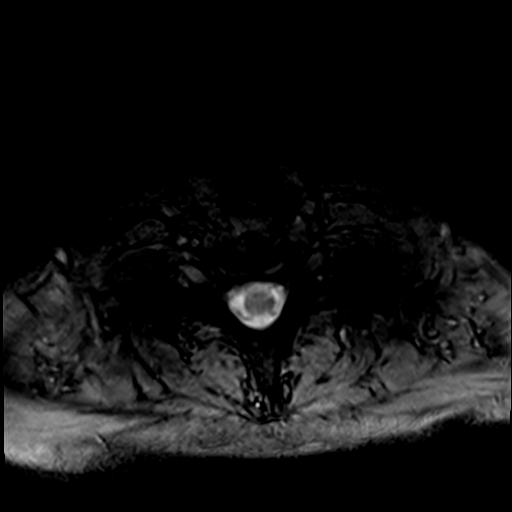
[im 10/33]
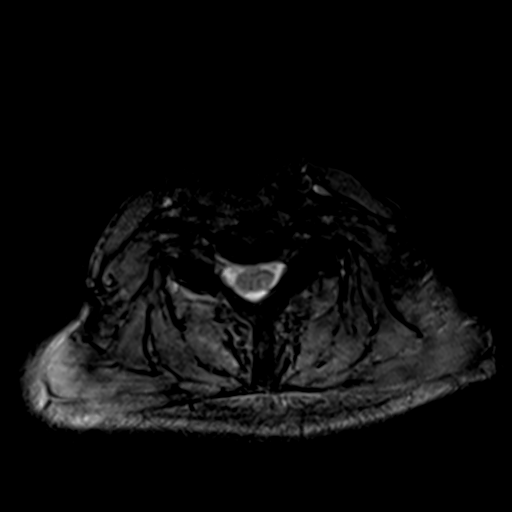

[30 of 48 positions shown; findings below may reference images not displayed]

FINDINGS: Alignment: Preserved cervical lordosis. No significant static
listhesis.

Vertebrae: No fracture, evidence of discitis, or bone lesion.

Cord: Normal signal and morphology.

Posterior Fossa, vertebral arteries, paraspinal tissues: Negative.

Disc levels:

C2-C3: No disc protrusion. Mild bilateral facet arthropathy. No
foraminal or canal stenosis.

C3-C4: No disc protrusion. Moderate right and mild left facet
arthropathy with mild right uncovertebral spurring. Findings result
in mild-moderate right-sided foraminal stenosis. No canal stenosis.

C4-C5: Shallow central disc protrusion. Mild bilateral facet
arthropathy and mild right uncovertebral spurring. Findings result
in mild right foraminal stenosis with mild canal stenosis.

C5-C6: Disc osteophyte complex with bilateral facet and
uncovertebral arthropathy. Findings result in moderate to severe
left and mild right foraminal stenosis with mild canal stenosis.

C6-C7: Minimal disc osteophyte complex and mild facet arthropathy.
No foraminal or canal stenosis.

C7-T1: Negative.
IMPRESSION: 1. Multilevel cervical spondylosis, most pronounced at the C5-6
level where there is moderate-to-severe left and mild right
foraminal stenosis as well as mild canal stenosis.
2. Mild canal stenosis and mild right foraminal stenosis at C4-5.
3. Mild-moderate right-sided foraminal stenosis at C3-4.

## 2022-12-23 ENCOUNTER — Other Ambulatory Visit: Payer: Self-pay | Admitting: Family

## 2022-12-23 DIAGNOSIS — Z1231 Encounter for screening mammogram for malignant neoplasm of breast: Secondary | ICD-10-CM

## 2022-12-31 ENCOUNTER — Ambulatory Visit
Admission: RE | Admit: 2022-12-31 | Discharge: 2022-12-31 | Disposition: A | Discharge status: Home / Self Care | Payer: 59 | Source: Ambulatory Visit | Attending: Family | Admitting: Family

## 2022-12-31 DIAGNOSIS — Z1231 Encounter for screening mammogram for malignant neoplasm of breast: Secondary | ICD-10-CM | POA: Diagnosis present
